# Patient Record
Sex: Female | Born: 2000 | Race: Black or African American | Hispanic: No | Marital: Single
Health system: Southern US, Community
[De-identification: ages and names within clinical notes are randomized; demographics above are authoritative.]

## PROBLEM LIST (undated history)

## (undated) DIAGNOSIS — R6250 Unspecified lack of expected normal physiological development in childhood: Secondary | ICD-10-CM

## (undated) DIAGNOSIS — R569 Unspecified convulsions: Secondary | ICD-10-CM

## (undated) DIAGNOSIS — H548 Legal blindness, as defined in USA: Secondary | ICD-10-CM

## (undated) HISTORY — PX: MOUTH SURGERY: SHX715

## (undated) HISTORY — PX: CHOLECYSTECTOMY: SHX55

---
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
## 2020-03-30 ENCOUNTER — Emergency Department (HOSPITAL_COMMUNITY)
Admission: EM | Admit: 2020-03-30 | Discharge: 2020-03-30 | Disposition: A | Discharge status: Home / Self Care | Payer: Medicaid Other | Attending: Emergency Medicine | Admitting: Emergency Medicine

## 2020-03-30 ENCOUNTER — Encounter (HOSPITAL_COMMUNITY): Payer: Self-pay | Admitting: Emergency Medicine

## 2020-03-30 ENCOUNTER — Other Ambulatory Visit: Payer: Self-pay

## 2020-03-30 DIAGNOSIS — Z79899 Other long term (current) drug therapy: Secondary | ICD-10-CM | POA: Insufficient documentation

## 2020-03-30 DIAGNOSIS — G40909 Epilepsy, unspecified, not intractable, without status epilepticus: Secondary | ICD-10-CM | POA: Diagnosis not present

## 2020-03-30 DIAGNOSIS — R569 Unspecified convulsions: Secondary | ICD-10-CM | POA: Diagnosis present

## 2020-03-30 HISTORY — DX: Unspecified convulsions: R56.9

## 2020-03-30 HISTORY — DX: Legal blindness, as defined in USA: H54.8

## 2020-03-30 HISTORY — DX: Unspecified lack of expected normal physiological development in childhood: R62.50

## 2020-03-30 MED ORDER — LEVETIRACETAM 100 MG/ML PO SOLN
1000.0000 mg | Freq: Once | ORAL | Status: AC
  Administered 2020-03-30: 1000 mg via ORAL
  Filled 2020-03-30: qty 10

## 2020-03-30 NOTE — ED Triage Notes (Signed)
Pt from home via [facility]. Mother reports pt had seizure that lasted less than 5 minutes. Upon EMS arrival per mother pt was "back to her normal."

## 2020-03-30 NOTE — ED Provider Notes (Signed)
[facility] EMERGENCY DEPARTMENT Provider Note   CSN: [id number] Arrival date & time: 03/30/20  1954     History Chief Complaint  Patient presents with   Seizures    Jennifer Blackwell is a 19 y.o. female.  Patient's mother reports patient has a history of seizures.  She was on Keppra her physician decided to stop her Keppra a year ago as it had been a significant period of time since she had had a seizure.  That had a seizure on 7 2 and was evaluated at [facility].  Patient had laboratory evaluations and a urine neurology was consulted who advised restarting her Keppra.  Patient's mother reports that she has struggled with giving patient her dosages of Keppra and that she has missed yesterday and today.  Patient had a seizure at home that lasted approximately 5 minutes patient has returned to her baseline.  Patient has not had any new symptoms she has not had a fever or chills no cough or congestion patient has not had any head injuries.  No change in urination.  Has been acting normally  The history is provided by a parent. No language interpreter was used.  Seizures Seizure activity on arrival: no   Seizure type:  Focal      Past Medical History:  Diagnosis Date   Developmental delay    Legally blind    Seizures (HCC)     There are no problems to display for this patient.   Past Surgical History:  Procedure Laterality Date   CHOLECYSTECTOMY     MOUTH SURGERY       OB History   No obstetric history on file.     No family history on file.  Social History   Tobacco Use   Smoking status: Not on file  Substance Use Topics   Alcohol use: Not on file   Drug use: Not on file    Home Medications Prior to Admission medications   Medication Sig Start Date End Date Taking? Authorizing Provider  diphenhydrAMINE (BENADRYL) 25 mg capsule Take 25 mg by mouth daily as needed for allergies.   Yes [provider]  levETIRAcetam  (KEPPRA) 100 MG/ML solution Take 1,000 mg by mouth 2 (two) times daily.   Yes [provider]  medroxyPROGESTERone (DEPO-PROVERA) 150 MG/ML injection Inject 150 mg into the muscle every 3 (three) months. 02/27/20  Yes [provider]  polyethylene glycol powder (GLYCOLAX/MIRALAX) 17 GM/SCOOP powder Take 17 g by mouth daily as needed for mild constipation or moderate constipation.  03/13/20  Yes [provider]    Allergies    Patient has no known allergies.  Review of Systems   Review of Systems  Neurological: Positive for seizures.  All other systems reviewed and are negative.   Physical Exam Updated Vital Signs BP 109/86 (BP Location: Left Arm)    Pulse (!) 122    Temp 97.6 F (36.4 C) (Temporal)    Resp 17    Wt 95.3 kg    SpO2 97%   Physical Exam Vitals and nursing note reviewed.  Constitutional:      Appearance: She is well-developed.  HENT:     Head: Normocephalic and atraumatic.  Eyes:     Comments: Blind bilat, gray   Cardiovascular:     Rate and Rhythm: Normal rate.  Pulmonary:     Effort: Pulmonary effort is normal.  Abdominal:     General: There is no distension.  Musculoskeletal:  General: Normal range of motion.     Cervical back: Normal range of motion.  Neurological:     General: No focal deficit present.     Mental Status: She is alert.     Comments: Pt answer basic questions.    Psychiatric:        Mood and Affect: Mood normal.     ED Results / Procedures / Treatments   Labs (all labs ordered are listed, but only abnormal results are displayed) Labs Reviewed - No data to display  EKG None  Radiology No results found.  Procedures Procedures (including critical care time)  Medications Ordered in ED Medications  levETIRAcetam (KEPPRA) 100 MG/ML solution 1,000 mg (has no administration in time range)    ED Course  I have reviewed the triage vital signs and the nursing notes.  Pertinent labs & imaging  results that were available during my care of the patient were reviewed by me and considered in my medical decision making (see chart for details).    MDM Rules/Calculators/A&P                          Mother reports pt is normal for her.  Pt's records from [facility] reviewed.  Pt given keppra here.   Final Clinical Impression(s) / ED Diagnoses Final diagnoses:  Seizure disorder (HCC)    Rx / DC Orders ED Discharge Orders    None    An After Visit Summary was printed and given to the patient.    Osie Cheeks 03/30/20 2209    Mancel Bale, MD 04/01/20 1145

## 2020-03-30 NOTE — Discharge Instructions (Addendum)
Make sure pt gets her seizure medication as prescribed.  Follow up with neurology as scheduled
